# Patient Record
Sex: Male | Born: 1985 | Race: Black or African American | Hispanic: No | Marital: Married | Smoking: Former smoker
Health system: Southern US, Community
[De-identification: ages and names within clinical notes are randomized; demographics above are authoritative.]

## PROBLEM LIST (undated history)

## (undated) DIAGNOSIS — G35D Multiple sclerosis, unspecified: Secondary | ICD-10-CM

## (undated) DIAGNOSIS — G35 Multiple sclerosis: Secondary | ICD-10-CM

## (undated) HISTORY — DX: Multiple sclerosis, unspecified: G35.D

## (undated) HISTORY — DX: Multiple sclerosis: G35

---
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
## 2008-06-07 ENCOUNTER — Emergency Department: Payer: Self-pay | Admitting: Emergency Medicine

## 2012-02-13 DIAGNOSIS — Z8669 Personal history of other diseases of the nervous system and sense organs: Secondary | ICD-10-CM | POA: Insufficient documentation

## 2013-04-02 DIAGNOSIS — R300 Dysuria: Secondary | ICD-10-CM | POA: Insufficient documentation

## 2015-11-09 ENCOUNTER — Emergency Department
Admission: EM | Admit: 2015-11-09 | Discharge: 2015-11-09 | Disposition: A | Discharge status: Home / Self Care | Payer: BLUE CROSS/BLUE SHIELD | Attending: Emergency Medicine | Admitting: Emergency Medicine

## 2015-11-09 DIAGNOSIS — L0231 Cutaneous abscess of buttock: Secondary | ICD-10-CM | POA: Insufficient documentation

## 2015-11-09 DIAGNOSIS — G35 Multiple sclerosis: Secondary | ICD-10-CM | POA: Insufficient documentation

## 2015-11-09 MED ORDER — SULFAMETHOXAZOLE-TRIMETHOPRIM 800-160 MG PO TABS
1.0000 | ORAL_TABLET | Freq: Two times a day (BID) | ORAL | Status: DC
  Administered 2015-11-09: 1 via ORAL

## 2015-11-09 MED ORDER — SULFAMETHOXAZOLE-TRIMETHOPRIM 800-160 MG PO TABS
ORAL_TABLET | ORAL | Status: AC
  Filled 2015-11-09: qty 1

## 2015-11-09 MED ORDER — BACITRACIN ZINC 500 UNIT/GM EX OINT
TOPICAL_OINTMENT | CUTANEOUS | Status: DC
Start: 2015-11-09 — End: 2015-11-09
  Filled 2015-11-09: qty 0.9

## 2015-11-09 MED ORDER — SULFAMETHOXAZOLE-TRIMETHOPRIM 800-160 MG PO TABS: 1.0000 | ORAL_TABLET | Freq: Two times a day (BID) | ORAL | 0 refills | Status: AC

## 2015-11-09 NOTE — Discharge Instructions (Signed)
Take the prescription med as directed. Return to the ED for wound changes or signs of worsening infection as discussed. Follow-up with [facility] for routine wound care.

## 2015-11-09 NOTE — ED Provider Notes (Signed)
[facility] Emergency Department Provider Note ____________________________________________  Time seen: [id number]  I have reviewed the triage vital signs and the nursing notes.  HISTORY  Chief Complaint  Abscess  HPI Benjamin Mejia is a 30 y.o. male visits to the ED for evaluation of a spontaneously draining abscess to the right buttocks. Patient describes fullness to the right of the buttocks for the last week or so. He does admit to squeezing the area today after a spontaneous drainage while at work. He describes a moderate amount of purulent discharge in case he notes some purulent drainage. He denies any interim fevers, chills, sweats. Also denies any rectal bleeding or pain with defecation.  History reviewed. No pertinent past medical history.  Patient Active Problem List   Diagnosis Date Noted  . Multiple sclerosis (HCC) 11/09/2015    History reviewed. No pertinent surgical history.  Prior to Admission medications   Medication Sig Start Date End Date Taking? Authorizing Provider  sulfamethoxazole-trimethoprim (BACTRIM DS,SEPTRA DS) 800-160 MG tablet Take 1 tablet by mouth 2 (two) times daily. 11/09/15   Shamya Macfadden V Bacon Myrl Lazarus, PA-C    Allergies Review of patient's allergies indicates no known allergies.  No family history on file.  Social History Social History  Substance Use Topics  . Smoking status: Never Smoker  . Smokeless tobacco: Never Used  . Alcohol use Yes   Review of Systems  Constitutional: Negative for fever. Cardiovascular: Negative for chest pain. Respiratory: Negative for shortness of breath. Musculoskeletal: Negative for back pain. Skin: Negative for rash. Right buttocks abscess as above. ____________________________________________  PHYSICAL EXAM:  VITAL SIGNS: ED Triage Vitals  Enc Vitals Group     BP 11/09/15 1717 (!) 154/87     Pulse Rate 11/09/15 1717 99     Resp 11/09/15 1717 18     Temp 11/09/15 1717 98.9 F  (37.2 C)     Temp Source 11/09/15 1717 Oral     SpO2 11/09/15 1717 100 %     Weight 11/09/15 1715 195 lb (88.5 kg)     Height 11/09/15 1715 5\' 10"  (1.778 m)     Head Circumference --      Peak Flow --      Pain Score 11/09/15 1715 0     Pain Loc --      Pain Edu? --      Excl. in GC? --    Constitutional: Alert and oriented. Well appearing and in no distress. Head: Normocephalic and atraumatic. Musculoskeletal: Nontender with normal range of motion in all extremities.  Neurologic:  Normal gait without ataxia. Normal speech and language. No gross focal neurologic deficits are appreciated. Skin:  Skin is warm, dry and intact. No rash noted. Patient with a spontaneously draining abscess to the right buttocks well into the gluteal cleft. There is no surrounding induration, firmness, cellulitis, or tenderness. Small amount of serosanguineous purulent discharge is noted with manipulation. ____________________________________________  PROCEDURES  Bactrim DS 1 PO ____________________________________________  INITIAL IMPRESSION / ASSESSMENT AND PLAN / ED COURSE  A shin with a spontaneously draining right buttocks abscess on presentation today. He has no surrounding induration or firmness noted. The wound is covered and dressed with a bulky dressing. Patient is discharged with a prescription for Bactrim and will continue to dose Tylenol or Motrin as needed. He is encouraged to apply warm compresses to promote healing. He will monitor very closely [facility] as needed. Return precautions are reviewed.  Clinical  Course   ____________________________________________  FINAL CLINICAL IMPRESSION(S) / ED DIAGNOSES  Final diagnoses:  Abscess of buttock, right      Lissa Hoard, PA-C 11/09/15 2142    Arnaldo Natal, MD 11/10/15 [phone]

## 2015-11-09 NOTE — ED Notes (Signed)
Pt states he has an abscess between his buttock that believes began to drain today

## 2015-11-09 NOTE — ED Triage Notes (Signed)
Pt c/o abscess of the right buttocks that began to drain today.

## 2015-11-15 ENCOUNTER — Encounter: Payer: Self-pay | Admitting: Surgery

## 2015-11-15 ENCOUNTER — Ambulatory Visit (INDEPENDENT_AMBULATORY_CARE_PROVIDER_SITE_OTHER): Payer: BLUE CROSS/BLUE SHIELD | Admitting: Surgery

## 2015-11-15 DIAGNOSIS — K610 Anal abscess: Secondary | ICD-10-CM

## 2015-11-15 NOTE — Patient Instructions (Signed)
Please call our office with any questions or if this area returns.

## 2015-11-15 NOTE — Progress Notes (Signed)
Subjective:     Patient ID: Benjamin Mejia, male   DOB: [birth year], 30 y.o.   MRN: [id number]  HPI  30 year old with a perianal abscess sent to [place] from the ED. The patient first had this come up about a year ago and did drain spontaneously and he improved without any issues. Patient states that the area still state a little firm and would occasionally flare and lead for about a day and then get better. Patient states that this one did not and he ended up going to the emergency department once it was causing a fairly significant amount of pain and started draining some purulence. He was seen in the emergency room they put him on Bactrim. The patient states that he's been doing better since then that is still draining some purulent material but now is more bloody-looking. Patient states that the area is much softer. The patient has been having normal soft bowel movements.  Past Medical History:  Diagnosis Date  . Multiple sclerosis (HCC)    History reviewed. No pertinent surgical history. Family History  Problem Relation Age of Onset  . Hypertension Mother   . Hypertension Father   . Thyroid disease Maternal Aunt   . Cancer Paternal Aunt   . Diabetes Maternal Grandmother   . Diabetes Maternal Grandfather   . Stroke Paternal Aunt    Social History   Social History  . Marital status: Married    Spouse name: N/A  . Number of children: N/A  . Years of education: N/A   Social History Main Topics  . Smoking status: Former Smoker    Packs/day: 0.25    Quit date: 11/15/2006  . Smokeless tobacco: Never Used  . Alcohol use Yes     Comment: 20 beers weekly  . Drug use: No  . Sexual activity: Not Asked   Other Topics Concern  . None   Social History Narrative  . None    Current Outpatient Prescriptions:  .  Fingolimod HCl (GILENYA) 0.5 MG CAPS, Take 0.5 capsules by mouth 1 day or 1 dose., Disp: , Rfl:  .  sulfamethoxazole-trimethoprim (BACTRIM DS,SEPTRA DS) 800-160 MG tablet, Take 1  tablet by mouth 2 (two) times daily., Disp: 20 tablet, Rfl: 0 No Known Allergies    Review of Systems  Constitutional: Negative for activity change, appetite change, chills, fatigue and fever.  HENT: Negative for congestion and sore throat.   Respiratory: Negative for cough, choking, shortness of breath and wheezing.   Cardiovascular: Negative for chest pain, palpitations and leg swelling.  Gastrointestinal: Positive for anal bleeding and constipation. Negative for abdominal pain, blood in stool, diarrhea, nausea and vomiting.  Genitourinary: Negative for dysuria, frequency and hematuria.  Musculoskeletal: Negative for back pain.  Skin: Positive for wound. Negative for color change, pallor and rash.  Neurological: Negative for dizziness and weakness.  Hematological: Negative for adenopathy. Does not bruise/bleed easily.  Psychiatric/Behavioral: Negative for agitation. The patient is not nervous/anxious.   All other systems reviewed and are negative.      Vitals:   11/15/15 0945  BP: (!) 146/87  Pulse: 86  Temp: 99.1 F (37.3 C)    Objective:   Physical Exam  Constitutional: He is oriented to person, place, and time. He appears well-developed and well-nourished. No distress.  HENT:  Head: Normocephalic and atraumatic.  Right Ear: External ear normal.  Left Ear: External ear normal.  Nose: Nose normal.  Mouth/Throat: Oropharynx is clear and moist. No oropharyngeal exudate.  Eyes:  Conjunctivae and EOM are normal. Pupils are equal, round, and reactive to light. No scleral icterus.  Neck: Normal range of motion. Neck supple. No tracheal deviation present.  Cardiovascular: Normal rate, regular rhythm, normal heart sounds and intact distal pulses.  Exam reveals no gallop and no friction rub.   No murmur heard. Pulmonary/Chest: Effort normal and breath sounds normal. No respiratory distress. He has no wheezes. He has no rales.  Abdominal: Soft. Bowel sounds are normal. He exhibits  no distension. There is no tenderness. There is no rebound and no guarding.  Genitourinary:  Genitourinary Comments: nora ml appearing anal mucosa, good tone, right posterior <295mm area of induration, without tenderness, erythema or drainage  Musculoskeletal: Normal range of motion. He exhibits no edema, tenderness or deformity.  Neurological: He is alert and oriented to person, place, and time. No cranial nerve deficit.  Skin: Skin is warm and dry. No rash noted. No erythema. No pallor.  Psychiatric: He has a normal mood and affect. His behavior is normal. Judgment and thought content normal.  Vitals reviewed.      Assessment:     30 year old male with a perianal abscess    Plan:     I reviewed the patient's past medical history to include his multiple sclerosis. The patient is on Gilenya and has had some leukocytosis which is being monitored. I have also personally reviewed patient's laboratory values were didn't have an elevated white blood cell count. I also personally reviewed his records from the emergency room.  He did have a perianal abscess which is now almost completely healed. Due to this I would not recommend any excision at this time. I did discuss with the patient that should this come back after he completes his course of Bactrim that he should give us a call. I also discussed with the patient keeping his stools nice and soft by increasing his water intake and his fiber intake and using stool softeners as needed. The patient was in agreement with this plan and we will have him follow-up with he gets any other areas like this.

## 2017-05-28 ENCOUNTER — Other Ambulatory Visit: Payer: Self-pay

## 2017-05-28 ENCOUNTER — Emergency Department: Payer: BLUE CROSS/BLUE SHIELD

## 2017-05-28 ENCOUNTER — Ambulatory Visit (HOSPITAL_COMMUNITY)
Admission: AD | Admit: 2017-05-28 | Discharge: 2017-05-28 | Disposition: A | Discharge status: Home / Self Care | Payer: BLUE CROSS/BLUE SHIELD | Source: Other Acute Inpatient Hospital | Attending: Student in an Organized Health Care Education/Training Program | Admitting: Student in an Organized Health Care Education/Training Program

## 2017-05-28 ENCOUNTER — Encounter: Payer: Self-pay | Admitting: Emergency Medicine

## 2017-05-28 ENCOUNTER — Emergency Department
Admission: EM | Admit: 2017-05-28 | Discharge: 2017-05-28 | Disposition: A | Discharge status: Other Acute Inpatient Hospital | Payer: BLUE CROSS/BLUE SHIELD | Attending: Student in an Organized Health Care Education/Training Program | Admitting: Student in an Organized Health Care Education/Training Program

## 2017-05-28 ENCOUNTER — Emergency Department (HOSPITAL_COMMUNITY)
Admission: EM | Admit: 2017-05-28 | Discharge: 2017-05-28 | Discharge status: Against Medical Advice | Payer: BLUE CROSS/BLUE SHIELD | Source: Home / Self Care

## 2017-05-28 DIAGNOSIS — Z79899 Other long term (current) drug therapy: Secondary | ICD-10-CM | POA: Diagnosis not present

## 2017-05-28 DIAGNOSIS — I7102 Dissection of abdominal aorta: Secondary | ICD-10-CM | POA: Diagnosis not present

## 2017-05-28 DIAGNOSIS — G35 Multiple sclerosis: Secondary | ICD-10-CM | POA: Diagnosis not present

## 2017-05-28 DIAGNOSIS — R109 Unspecified abdominal pain: Secondary | ICD-10-CM | POA: Diagnosis present

## 2017-05-28 DIAGNOSIS — Z87891 Personal history of nicotine dependence: Secondary | ICD-10-CM | POA: Diagnosis not present

## 2017-05-28 DIAGNOSIS — R1031 Right lower quadrant pain: Secondary | ICD-10-CM | POA: Diagnosis present

## 2017-05-28 LAB — URINALYSIS, COMPLETE (UACMP) WITH MICROSCOPIC
Bacteria, UA: NONE SEEN
Bilirubin Urine: NEGATIVE
GLUCOSE, UA: NEGATIVE mg/dL
Hgb urine dipstick: NEGATIVE
Ketones, ur: NEGATIVE mg/dL
Leukocytes, UA: NEGATIVE
Nitrite: NEGATIVE
PH: 6 (ref 5.0–8.0)
Protein, ur: NEGATIVE mg/dL
Specific Gravity, Urine: 1.015 (ref 1.005–1.030)
Squamous Epithelial / LPF: NONE SEEN

## 2017-05-28 LAB — CBC
HCT: 39.3 % — ABNORMAL LOW (ref 40.0–52.0)
Hemoglobin: 13.2 g/dL (ref 13.0–18.0)
MCH: 31.1 pg (ref 26.0–34.0)
MCHC: 33.5 g/dL (ref 32.0–36.0)
MCV: 92.9 fL (ref 80.0–100.0)
PLATELETS: 202 10*3/uL (ref 150–440)
RBC: 4.23 MIL/uL — AB (ref 4.40–5.90)
RDW: 15 % — ABNORMAL HIGH (ref 11.5–14.5)
WBC: 5.3 10*3/uL (ref 3.8–10.6)

## 2017-05-28 LAB — COMPREHENSIVE METABOLIC PANEL
ALK PHOS: 68 U/L (ref 38–126)
ALT: 32 U/L (ref 17–63)
AST: 40 U/L (ref 15–41)
Albumin: 4.6 g/dL (ref 3.5–5.0)
Anion gap: 8 (ref 5–15)
BUN: 13 mg/dL (ref 6–20)
CALCIUM: 9.5 mg/dL (ref 8.9–10.3)
CO2: 24 mmol/L (ref 22–32)
Chloride: 108 mmol/L (ref 101–111)
Creatinine, Ser: 0.91 mg/dL (ref 0.61–1.24)
GFR calc non Af Amer: >60 mL/min (ref >60)
GLUCOSE: 94 mg/dL (ref 65–99)
Potassium: 4.4 mmol/L (ref 3.5–5.1)
SODIUM: 140 mmol/L (ref 135–145)
Total Bilirubin: 0.7 mg/dL (ref 0.3–1.2)
Total Protein: 7.9 g/dL (ref 6.5–8.1)

## 2017-05-28 LAB — LIPASE, BLOOD: Lipase: 21 U/L (ref 11–51)

## 2017-05-28 MED ORDER — MIDAZOLAM HCL 5 MG/5ML IJ SOLN
INTRAMUSCULAR | Status: AC
  Filled 2017-05-28: qty 5

## 2017-05-28 MED ORDER — IOHEXOL 350 MG/ML SOLN
75.0000 mL | Freq: Once | INTRAVENOUS | Status: AC | PRN
  Administered 2017-05-28: 75 mL via INTRAVENOUS
  Filled 2017-05-28: qty 75

## 2017-05-28 MED ORDER — MIDAZOLAM HCL 2 MG/2ML IJ SOLN
1.0000 mg | Freq: Once | INTRAMUSCULAR | Status: AC
  Administered 2017-05-28: 1 mg via INTRAVENOUS

## 2017-05-28 MED ORDER — FENTANYL CITRATE (PF) 100 MCG/2ML IJ SOLN
50.0000 ug | INTRAMUSCULAR | Status: DC | PRN
  Filled 2017-05-28: qty 2

## 2017-05-28 MED ORDER — LABETALOL HCL 5 MG/ML IV SOLN
10.0000 mg | Freq: Once | INTRAVENOUS | Status: AC
  Administered 2017-05-28: 10 mg via INTRAVENOUS
  Filled 2017-05-28 (×2): qty 4

## 2017-05-28 MED ORDER — ESMOLOL HCL-SODIUM CHLORIDE 2000 MG/100ML IV SOLN
3.0000 ug/kg/min | Freq: Once | INTRAVENOUS | Status: AC
  Administered 2017-05-28 (×2): 3 ug/kg/min via INTRAVENOUS
  Filled 2017-05-28 (×2): qty 100

## 2017-05-28 MED ORDER — IOPAMIDOL (ISOVUE-300) INJECTION 61%
100.0000 mL | Freq: Once | INTRAVENOUS | Status: AC | PRN
  Administered 2017-05-28: 100 mL via INTRAVENOUS
  Filled 2017-05-28: qty 100

## 2017-05-28 MED ORDER — NICARDIPINE HCL IN NACL 20-0.86 MG/200ML-% IV SOLN
3.0000 mg/h | Freq: Once | INTRAVENOUS | Status: AC
  Administered 2017-05-28: 5 mg/h via INTRAVENOUS
  Administered 2017-05-28: 15 mg/h via INTRAVENOUS
  Filled 2017-05-28 (×2): qty 200

## 2017-05-28 MED ORDER — SODIUM CHLORIDE 0.9 % IV BOLUS
1000.0000 mL | Freq: Once | INTRAVENOUS | Status: AC
  Administered 2017-05-28: 1000 mL via INTRAVENOUS

## 2017-05-28 NOTE — ED Notes (Signed)
Patient transported to CT 

## 2017-05-28 NOTE — ED Provider Notes (Signed)
Dr. Roxan Hockey was not available to receive critical radiology report.  I took the report for him from radiology, and have discussed the case and concern for dissection with Dr. Roxan Hockey who is continuing to manage the patient.   Sharyn Creamer, MD 05/28/17 [phone]

## 2017-05-28 NOTE — ED Notes (Signed)
Pt has been having RUQ pain that radiates around to back. Denies any other symptoms.

## 2017-05-28 NOTE — ED Notes (Signed)
ED Provider at bedside. 

## 2017-05-28 NOTE — ED Provider Notes (Signed)
[facility] Emergency Department Provider Note    First MD Initiated Contact with Patient 05/28/17 1518     (approximate)  I have reviewed the triage vital signs and the nursing notes.   HISTORY  Chief Complaint Abdominal Pain    HPI Benjamin Mejia is a 32 y.o. male with a history of multiple sclerosis on Gilenya presents with 3-4 days of progressively worsening right lower quadrant abdominal pain.  No nausea or vomiting.  Has not noticed any sort of change in his appetite.  Has never had pain like this before.  No previous abdominal surgeries.  Denies any dysuria.  No diarrhea.  No constipation.  No family history of IBD.  States the pain currently is mild.  There is some worsening with movement.  Is not tried anything to alleviate the pain.  Past Medical History:  Diagnosis Date  . Multiple sclerosis (HCC)    Family History  Problem Relation Age of Onset  . Hypertension Mother   . Hypertension Father   . Thyroid disease Maternal Aunt   . Cancer Paternal Aunt   . Diabetes Maternal Grandmother   . Diabetes Maternal Grandfather   . Stroke Paternal Aunt    History reviewed. No pertinent surgical history. Patient Active Problem List   Diagnosis Date Noted  . Perianal abscess 11/15/2015  . Multiple sclerosis (HCC) 11/09/2015  . Dysuria 04/02/2013  . H/O optic neuritis 02/13/2012      Prior to Admission medications   Medication Sig Start Date End Date Taking? Authorizing Provider  Fingolimod HCl (GILENYA) 0.5 MG CAPS Take 0.5 capsules by mouth 1 day or 1 dose.   Yes [provider]  Multiple Vitamin (MULTIVITAMIN) capsule Take 1 capsule by mouth daily.   Yes [provider]  sulfamethoxazole-trimethoprim (BACTRIM DS,SEPTRA DS) 800-160 MG tablet Take 1 tablet by mouth 2 (two) times daily. Patient not taking: Reported on 05/28/2017 11/09/15   Menshew, Charlesetta Ivory, PA-C    Allergies Patient has no known  allergies.    Social History Social History   Tobacco Use  . Smoking status: Former Smoker    Packs/day: 0.25    Last attempt to quit: 11/15/2006    Years since quitting: 10.5  . Smokeless tobacco: Never Used  Substance Use Topics  . Alcohol use: Yes    Comment: 20 beers weekly  . Drug use: No    Review of Systems Patient denies headaches, rhinorrhea, blurry vision, numbness, shortness of breath, chest pain, edema, cough, abdominal pain, nausea, vomiting, diarrhea, dysuria, fevers, rashes or hallucinations unless otherwise stated above in HPI. ____________________________________________   PHYSICAL EXAM:  VITAL SIGNS: Vitals:   05/28/17 1811 05/28/17 1837  BP: 136/79 125/78  Pulse: 87 84  Resp: 19 17  Temp:  100.1 F (37.8 C)  SpO2: 99% 98%    Constitutional: Alert and oriented. Well appearing and in no acute distress. Eyes: Conjunctivae are normal.  Head: Atraumatic. Nose: No congestion/rhinnorhea. Mouth/Throat: Mucous membranes are moist.   Neck: No stridor. Painless ROM.  Cardiovascular: Normal rate, regular rhythm. Grossly normal heart sounds.  Good peripheral circulation. Respiratory: Normal respiratory effort.  No retractions. Lungs CTAB. Gastrointestinal: Soft with no focal peritonitis but does have rebound tenderness to the right lower quadrant. No distention. No abdominal bruits. No CVA tenderness. Genitourinary:  Musculoskeletal: No lower extremity tenderness nor edema.  No joint effusions. Neurologic:  Normal speech and language. No gross focal neurologic deficits are appreciated. No facial droop Skin:  Skin is warm, dry and intact. No rash noted. Psychiatric: Mood and affect are normal. Speech and behavior are normal.  ____________________________________________   LABS (all labs ordered are listed, but only abnormal results are displayed)  Results for orders placed or performed during the hospital encounter of 05/28/17 (from the past 24 hour(s))   Lipase, blood     Status: None   Collection Time: 05/28/17  1:57 PM  Result Value Ref Range   Lipase 21 11 - 51 U/L  Comprehensive metabolic panel     Status: None   Collection Time: 05/28/17  1:57 PM  Result Value Ref Range   Sodium 140 135 - 145 mmol/L   Potassium 4.4 3.5 - 5.1 mmol/L   Chloride 108 101 - 111 mmol/L   CO2 24 22 - 32 mmol/L   Glucose, Bld 94 65 - 99 mg/dL   BUN 13 6 - 20 mg/dL   Creatinine, Ser 1.61 0.61 - 1.24 mg/dL   Calcium 9.5 8.9 - 09.6 mg/dL   Total Protein 7.9 6.5 - 8.1 g/dL   Albumin 4.6 3.5 - 5.0 g/dL   AST 40 15 - 41 U/L   ALT 32 17 - 63 U/L   Alkaline Phosphatase 68 38 - 126 U/L   Total Bilirubin 0.7 0.3 - 1.2 mg/dL   GFR calc non Af Amer >60 >60 mL/min   GFR calc Af Amer >60 >60 mL/min   Anion gap 8 5 - 15  CBC     Status: Abnormal   Collection Time: 05/28/17  1:57 PM  Result Value Ref Range   WBC 5.3 3.8 - 10.6 K/uL   RBC 4.23 (L) 4.40 - 5.90 MIL/uL   Hemoglobin 13.2 13.0 - 18.0 g/dL   HCT 04.5 (L) 40.9 - 81.1 %   MCV 92.9 80.0 - 100.0 fL   MCH 31.1 26.0 - 34.0 pg   MCHC 33.5 32.0 - 36.0 g/dL   RDW 91.4 (H) 78.2 - 95.6 %   Platelets 202 150 - 440 K/uL  Urinalysis, Complete w Microscopic     Status: Abnormal   Collection Time: 05/28/17  1:57 PM  Result Value Ref Range   Color, Urine YELLOW (A) YELLOW   APPearance CLEAR (A) CLEAR   Specific Gravity, Urine 1.015 1.005 - 1.030   pH 6.0 5.0 - 8.0   Glucose, UA NEGATIVE NEGATIVE mg/dL   Hgb urine dipstick NEGATIVE NEGATIVE   Bilirubin Urine NEGATIVE NEGATIVE   Ketones, ur NEGATIVE NEGATIVE mg/dL   Protein, ur NEGATIVE NEGATIVE mg/dL   Nitrite NEGATIVE NEGATIVE   Leukocytes, UA NEGATIVE NEGATIVE   RBC / HPF 0-5 0 - 5 RBC/hpf   WBC, UA 0-5 0 - 5 WBC/hpf   Bacteria, UA NONE SEEN NONE SEEN   Squamous Epithelial / LPF NONE SEEN NONE SEEN   Mucus PRESENT    ____________________________________________  ____________________________________________  RADIOLOGY  I personally reviewed all  radiographic images ordered to evaluate for the above acute complaints and reviewed radiology reports and findings.  These findings were personally discussed with the patient.  Please see medical record for radiology report.  ____________________________________________   PROCEDURES  Procedure(s) performed:  .Critical Care Performed by: Willy Eddy, MD Authorized by: Willy Eddy, MD   Critical care provider statement:    Critical care time (minutes):  35   Critical care time was exclusive of:  Separately billable procedures and treating other patients   Critical care was necessary to treat or prevent imminent or life-threatening deterioration  of the following conditions: aortic dissection.   Critical care was time spent personally by me on the following activities:  Development of treatment plan with patient or surrogate, discussions with consultants, evaluation of patient's response to treatment, examination of patient, obtaining history from patient or surrogate, ordering and performing treatments and interventions, ordering and review of laboratory studies, ordering and review of radiographic studies, pulse oximetry, re-evaluation of patient's condition and review of old charts      Critical Care performed: no ____________________________________________   INITIAL IMPRESSION / ASSESSMENT AND PLAN / ED COURSE  Pertinent labs & imaging results that were available during my care of the patient were reviewed by me and considered in my medical decision making (see chart for details).  DDX: appy, colitis, msk strain, stone, enteritis  Benjamin Mejia is a 32 y.o. who presents to the ED with chief complaint of right lower quadrant and right flank pain for the past 4 days.  No change in appetite.  No radiation of pain.  No previous abdominal surgeries.  CT imaging will be ordered to evaluate for the above differential.  Clinical Course as of May 28 1844  Mon May 28, 2017   1605 Patient with CT evidence suggestive of acute abdominal aortic dissection.  Will order CT chest abdomen pelvis to exclude thoracic dissection.  Patient remains hemodynamically stable.  Does have mild tachycardia and elevated blood pressure therefore will give labetalol IV and continue to monitor patient.   [PR]  1723 CT angiogram shows evidence of celiac to femoral bifurcation acute dissection.  Spoke with Dr. Gilda Crease of vascular surgery who has recommended evaluation at [facility] for possible fenestrated stent placement as we do not have those capabilities here.  We will touch base with Kendell Bane.   [PR]  [phone] Patient currently on esmolol as well as Cardene drips.  Spoke with Dr. Pattricia Boss at [facility] who has requested patient be sent to ER for evaluation to further determine need for medical management versus OR intervention.  Patient and family updated at bedside.   [PR]    Clinical Course User Index [PR] Willy Eddy, MD     As part of my medical decision making, I reviewed the following data within the electronic MEDICAL RECORD NUMBER Nursing notes reviewed and incorporated, Labs reviewed, notes from prior ED visits and [place] Controlled Substance Database   ____________________________________________   FINAL CLINICAL IMPRESSION(S) / ED DIAGNOSES  Final diagnoses:  Aortic dissection, abdominal (HCC)      NEW MEDICATIONS STARTED DURING THIS VISIT:  New Prescriptions   No medications on file     Note:  This document was prepared using Dragon voice recognition software and may include unintentional dictation errors.    Willy Eddy, MD 05/28/17 [phone]

## 2017-05-28 NOTE — ED Notes (Signed)
[facility] at bedside to transport to [facility].  Pt remains on same gtts. VSS on leaving.

## 2017-05-28 NOTE — ED Notes (Signed)
Report to casey RN with [facility]

## 2017-05-28 NOTE — ED Notes (Signed)
Report to Dot Lanes RN with [facility] air care and [employer] certified [employer]

## 2017-05-28 NOTE — ED Notes (Signed)
Pt says he is on meds that can cause low wbc

## 2017-05-28 NOTE — ED Triage Notes (Signed)
First Nurse Note:  Arrives with C/O  right sided abdominal pain x 4 days.  Patient is AAOx3.  Skin warm and dry. NAD

## 2017-05-28 NOTE — ED Triage Notes (Signed)
Right lower quad abdominal pain since Thursday.

## 2017-05-28 NOTE — ED Notes (Signed)
Waiting on transport to [facility]. Remains on full cardiac monitor.

## 2019-01-01 ENCOUNTER — Other Ambulatory Visit: Payer: Self-pay

## 2019-01-01 DIAGNOSIS — Z20822 Contact with and (suspected) exposure to covid-19: Secondary | ICD-10-CM

## 2019-01-03 LAB — NOVEL CORONAVIRUS, NAA: SARS-CoV-2, NAA: NOT DETECTED

## 2019-03-27 IMAGING — CT CT ANGIO CHEST-ABD-PELV FOR DISSECTION W/ AND WO/W CM
2 of 7 series · 13 of 46 positions shown, 15 images · IV contrast (omnipaque)
Comparison: 05/28/2017 CT of abdomen and pelvis.

CLINICAL DATA: 32 y/o M; abdominal aortic dissection. Four days of
right lower quadrant pain.

EXAM:
CT ANGIOGRAPHY CHEST, ABDOMEN AND PELVIS
TECHNIQUE: Multidetector CT imaging through the chest, abdomen and pelvis was
performed using the standard protocol during bolus administration of
intravenous contrast. Multiplanar reconstructed images and MIPs were
obtained and reviewed to evaluate the vascular anatomy.
CONTRAST:  75mL OMNIPAQUE IOHEXOL 350 MG/ML SOLN

[Series 5: axial arterial · axial · arterial · 0.80mm/px · z∈[-627,-57]mm · 10 of 222 slices shown, 12 images]
[im 16/222  soft-tissue]
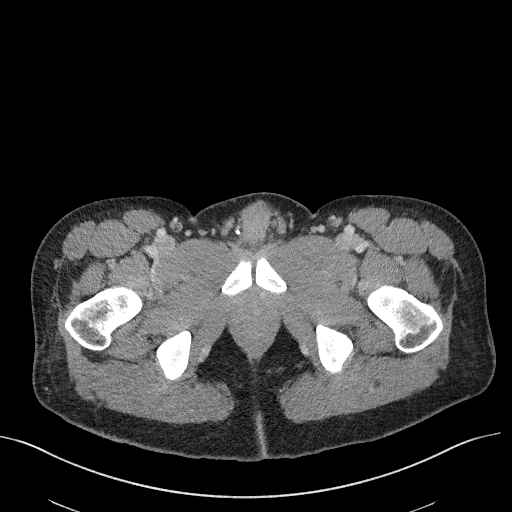
[im 16/222  bone]
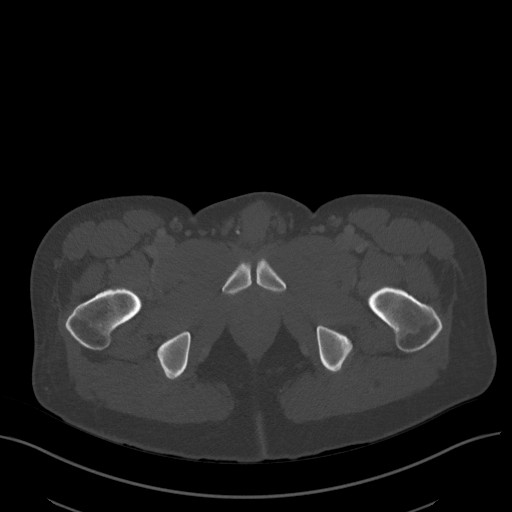
[im 32/222  soft-tissue]
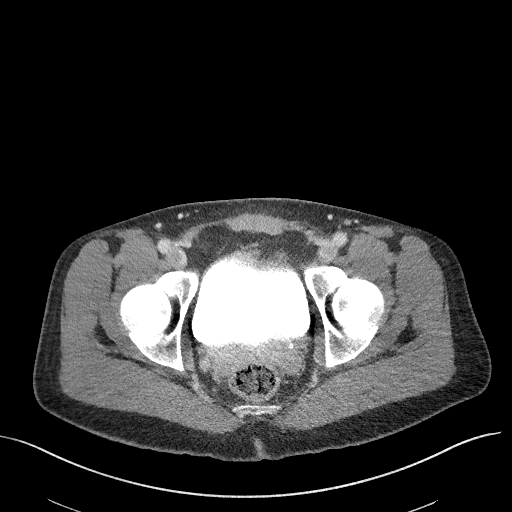
[im 64/222  soft-tissue]
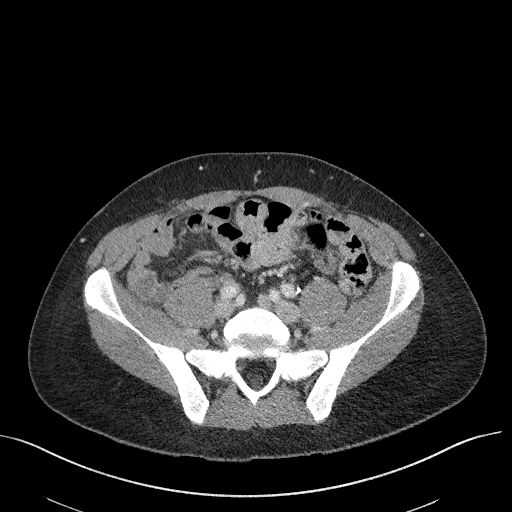
[im 79/222  soft-tissue]
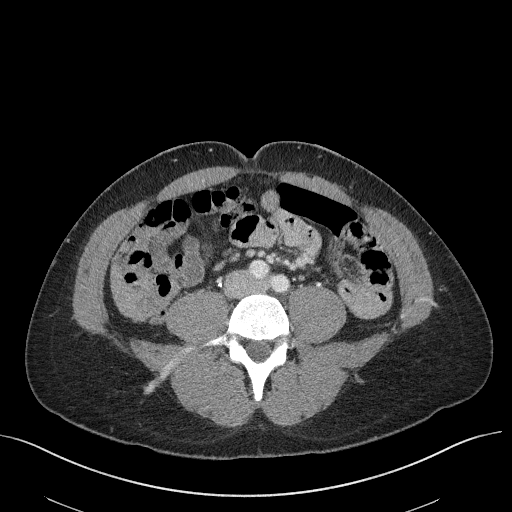
[im 95/222  soft-tissue]
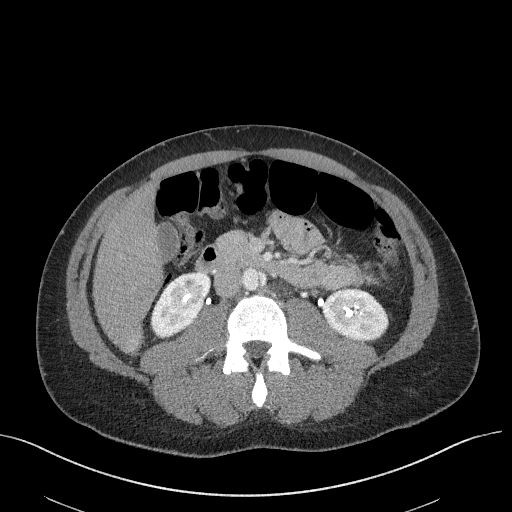
[im 127/222  soft-tissue]
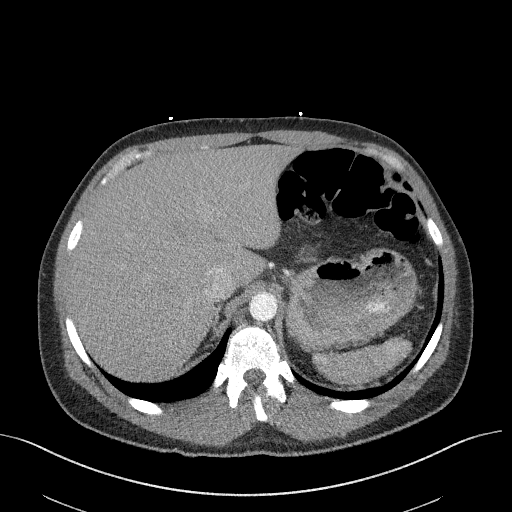
[im 143/222  soft-tissue]
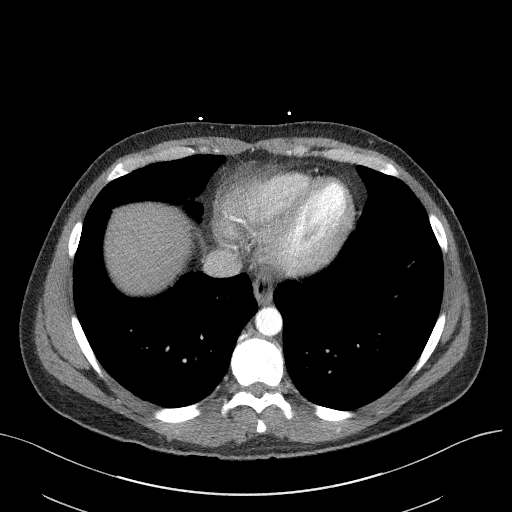
[im 158/222  soft-tissue]
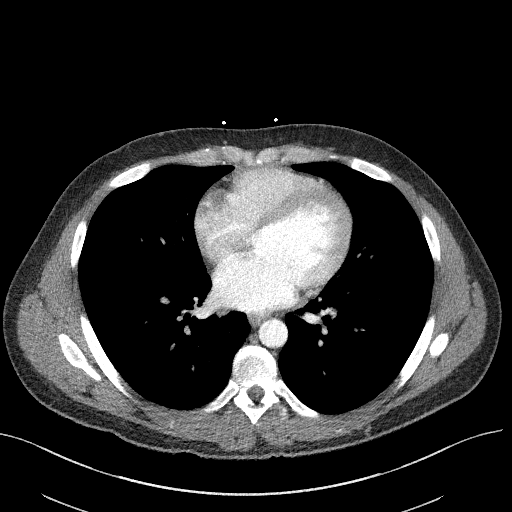
[im 190/222  soft-tissue]
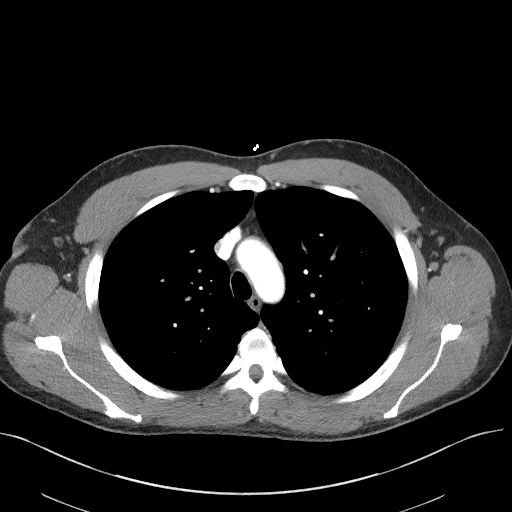
[im 190/222  bone]
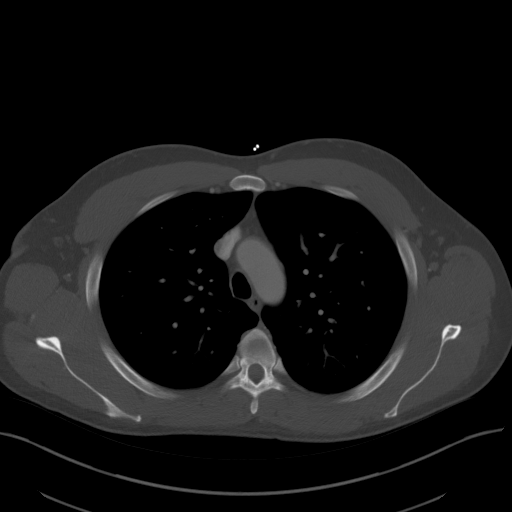
[im 206/222  soft-tissue]
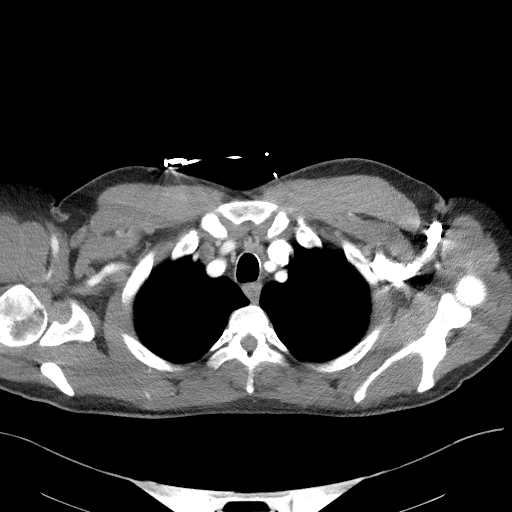

[Series 8: coronals · coronal · 0.72mm/px · 3 of 142 slices shown]
[im 36/142  soft-tissue]
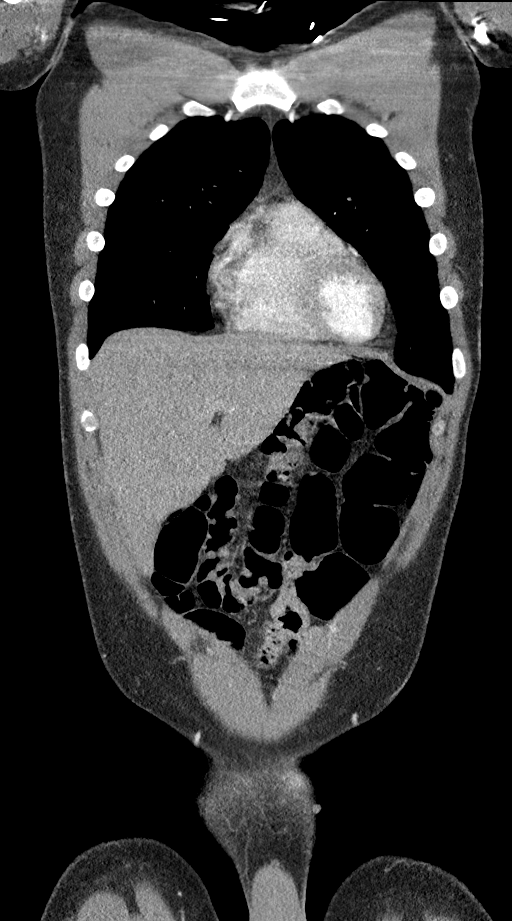
[im 71/142  soft-tissue]
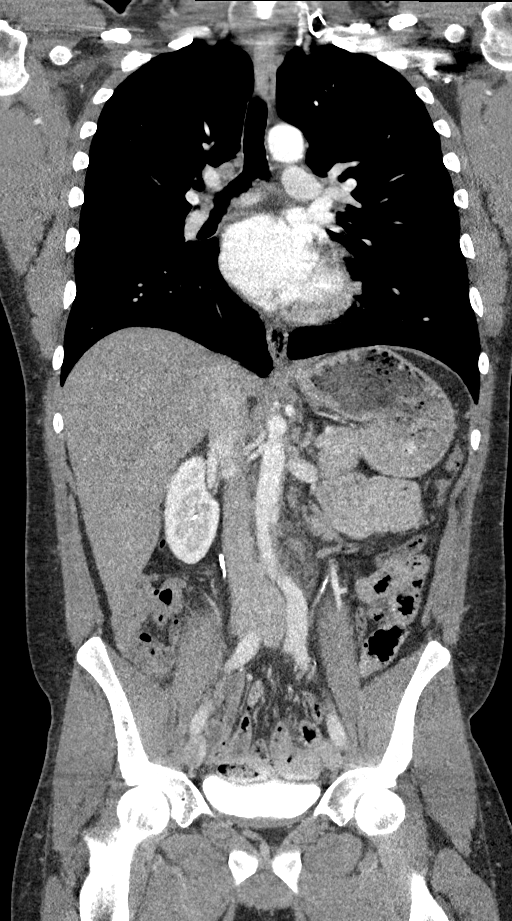
[im 106/142  soft-tissue]
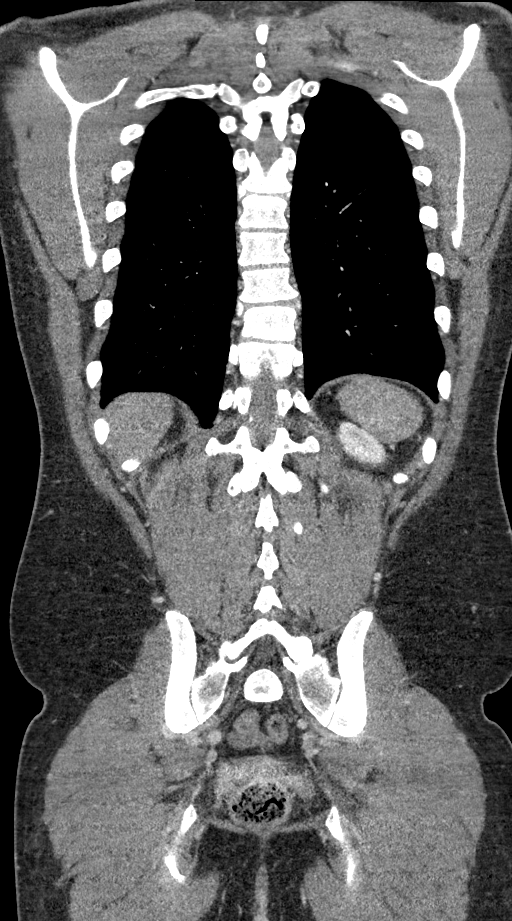

[13 of 46 positions shown; findings below may reference images not displayed]

FINDINGS: CTA CHEST FINDINGS

Cardiovascular: Preferential opacification of the thoracic aorta. No
evidence of thoracic aortic aneurysm or dissection. Normal heart
size. No pericardial effusion.

Mediastinum/Nodes: No enlarged mediastinal, hilar, or axillary lymph
nodes. Thyroid gland, trachea, and esophagus demonstrate no
significant findings.

Lungs/Pleura:

Musculoskeletal: No chest wall abnormality. No acute or significant
osseous findings.

Review of the MIP images confirms the above findings.

CTA ABDOMEN AND PELVIS FINDINGS

VASCULAR

Aorta: Dissection extending from the celiac axis to aortic
bifurcation. The dissection flap does not propagate into any of the
great vessel branches.

Celiac: Patent without evidence of aneurysm, dissection, vasculitis
or significant stenosis. Left gastric artery arises from aortic

SMA: Patent without evidence of aneurysm, dissection, vasculitis or
significant stenosis.

Renals: Both renal arteries are patent without evidence of aneurysm,
dissection, vasculitis, fibromuscular dysplasia or significant
stenosis.

IMA: Patent without evidence of aneurysm, dissection, vasculitis or
significant stenosis.

Inflow: Patent without evidence of aneurysm, dissection, vasculitis
or significant stenosis.

Veins: No obvious venous abnormality within the limitations of this
arterial phase study.

Review of the MIP images confirms the above findings.

NON-VASCULAR

Hepatobiliary: No focal liver abnormality is seen. No gallstones,
gallbladder wall thickening, or biliary dilatation.

Pancreas: Unremarkable. No pancreatic ductal dilatation or
surrounding inflammatory changes.

Spleen: Normal in size without focal abnormality.

Adrenals/Urinary Tract: Subcentimeter renal cysts bilaterally.
Adrenal glands are unremarkable. Kidneys otherwise are normal,
without renal calculi, focal lesion, or hydronephrosis. Bladder is
unremarkable.

Stomach/Bowel: Stomach is within normal limits. Appendix appears
normal. No evidence of bowel wall thickening, distention, or
inflammatory changes.

Lymphatic: No enlarged abdominal or pelvic lymph nodes.

Reproductive: Prostate is unremarkable.

Other: No abdominal wall hernia or abnormality. No abdominopelvic
ascites.

Musculoskeletal: No acute or significant osseous findings.

Review of the MIP images confirms the above findings.
IMPRESSION: 1. Abdominal aortic dissection extending from level of celiac axis
to bifurcation. Aortic major branch vessel origins arise from true
lumen and the dissection flap does not propagate into the vessel
origins. No thoracic component of the dissection.
2. Otherwise unremarkable CTA of the chest, abdomen, and pelvis.

By: Faljo Nadalina M.D.
